# Patient Record
Sex: Male | Born: 2000 | Race: White | Hispanic: No | Marital: Single | Smoking: Never smoker
Health system: Southern US, Community
[De-identification: ages and names within clinical notes are randomized; demographics above are authoritative.]

## PROBLEM LIST (undated history)

## (undated) DIAGNOSIS — J302 Other seasonal allergic rhinitis: Secondary | ICD-10-CM

---
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
## 2019-08-22 ENCOUNTER — Ambulatory Visit: Payer: Self-pay | Attending: Internal Medicine

## 2019-08-22 DIAGNOSIS — Z23 Encounter for immunization: Secondary | ICD-10-CM

## 2019-08-22 NOTE — Progress Notes (Signed)
   Covid-19 Vaccination Clinic  Name:  Eric Merritt    MRN: [id number] DOB: [birth year]  08/22/2019  Eric Merritt was observed post Covid-19 immunization for 15 minutes without incident. He was provided with Vaccine Information Sheet and instruction to access the V-Safe system.   Eric Merritt was instructed to call 911 with any severe reactions post vaccine: Marland Kitchen Difficulty breathing  . Swelling of face and throat  . A fast heartbeat  . A bad rash all over body  . Dizziness and weakness   Immunizations Administered    Name Date Dose VIS Date Route   Pfizer COVID-19 Vaccine 08/22/2019 12:35 PM 0.3 mL 05/13/2019 Intramuscular   Manufacturer: [employer], [employer]   Lot: [id number]   NDC: [address]-0

## 2019-09-14 ENCOUNTER — Ambulatory Visit: Payer: Self-pay | Attending: Internal Medicine

## 2019-09-14 DIAGNOSIS — Z23 Encounter for immunization: Secondary | ICD-10-CM

## 2019-09-14 NOTE — Progress Notes (Signed)
   Covid-19 Vaccination Clinic  Name:  Eric Merritt    MRN: [id number] DOB: September 08, 2000  09/14/2019  [name] was observed post Covid-19 immunization for 15 minutes without incident. He was provided with Vaccine Information Sheet and instruction to access the V-Safe system.   [name] was instructed to call 911 with any severe reactions post vaccine: Marland Kitchen Difficulty breathing  . Swelling of face and throat  . A fast heartbeat  . A bad rash all over body  . Dizziness and weakness   Immunizations Administered    Name Date Dose VIS Date Route   Pfizer COVID-19 Vaccine 09/14/2019  4:39 PM 0.3 mL 05/13/2019 Intramuscular   Manufacturer: [employer], [employer]   Lot: [id number]   NDC: [address]-0

## 2022-04-09 ENCOUNTER — Telehealth: Payer: Self-pay | Admitting: Orthopedic Surgery

## 2022-04-09 NOTE — Telephone Encounter (Signed)
Received call from patient. He needs 2014 records. Auth on file. [facility] records copy ready for patient to pickup at front desk.
